# Patient Record
Sex: Female | Born: 1980 | Race: Black or African American | Hispanic: No | Marital: Single | Smoking: Never smoker
Health system: Southern US, Community
[De-identification: ages and names within clinical notes are randomized; demographics above are authoritative.]

## PROBLEM LIST (undated history)

## (undated) DIAGNOSIS — G43009 Migraine without aura, not intractable, without status migrainosus: Secondary | ICD-10-CM

## (undated) DIAGNOSIS — D649 Anemia, unspecified: Secondary | ICD-10-CM

## (undated) DIAGNOSIS — J309 Allergic rhinitis, unspecified: Secondary | ICD-10-CM

## (undated) DIAGNOSIS — Z8742 Personal history of other diseases of the female genital tract: Secondary | ICD-10-CM

## (undated) HISTORY — DX: Anemia, unspecified: D64.9

## (undated) HISTORY — DX: Personal history of other diseases of the female genital tract: Z87.42

## (undated) HISTORY — DX: Allergic rhinitis, unspecified: J30.9

## (undated) HISTORY — DX: Migraine without aura, not intractable, without status migrainosus: G43.009

---
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
## 2001-09-15 ENCOUNTER — Encounter: Payer: Self-pay | Admitting: Obstetrics and Gynecology

## 2001-09-15 ENCOUNTER — Ambulatory Visit (HOSPITAL_COMMUNITY): Admission: RE | Admit: 2001-09-15 | Discharge: 2001-09-15 | Payer: Self-pay | Admitting: Obstetrics and Gynecology

## 2001-11-09 ENCOUNTER — Inpatient Hospital Stay (HOSPITAL_COMMUNITY): Admission: AD | Admit: 2001-11-09 | Discharge: 2001-11-11 | Payer: Self-pay | Admitting: Obstetrics and Gynecology

## 2001-11-12 ENCOUNTER — Encounter: Admission: RE | Admit: 2001-11-12 | Discharge: 2001-12-12 | Payer: Self-pay | Admitting: Obstetrics & Gynecology

## 2001-11-27 ENCOUNTER — Encounter (INDEPENDENT_AMBULATORY_CARE_PROVIDER_SITE_OTHER): Payer: Self-pay | Admitting: *Deleted

## 2001-11-27 LAB — CONVERTED CEMR LAB

## 2001-12-13 ENCOUNTER — Encounter: Admission: RE | Admit: 2001-12-13 | Discharge: 2002-01-12 | Payer: Self-pay | Admitting: Obstetrics & Gynecology

## 2001-12-21 ENCOUNTER — Other Ambulatory Visit: Admission: RE | Admit: 2001-12-21 | Discharge: 2001-12-21 | Payer: Self-pay | Admitting: Obstetrics and Gynecology

## 2002-05-18 ENCOUNTER — Emergency Department (HOSPITAL_COMMUNITY): Admission: EM | Admit: 2002-05-18 | Discharge: 2002-05-18 | Payer: Self-pay | Admitting: Emergency Medicine

## 2002-05-23 ENCOUNTER — Encounter: Admission: RE | Admit: 2002-05-23 | Discharge: 2002-05-23 | Payer: Self-pay | Admitting: Family Medicine

## 2002-06-23 ENCOUNTER — Encounter: Admission: RE | Admit: 2002-06-23 | Discharge: 2002-06-23 | Payer: Self-pay | Admitting: Family Medicine

## 2002-08-29 ENCOUNTER — Encounter: Admission: RE | Admit: 2002-08-29 | Discharge: 2002-08-29 | Payer: Self-pay | Admitting: Family Medicine

## 2002-10-23 ENCOUNTER — Encounter: Payer: Self-pay | Admitting: Sports Medicine

## 2002-10-23 ENCOUNTER — Encounter: Admission: RE | Admit: 2002-10-23 | Discharge: 2002-10-23 | Payer: Self-pay | Admitting: Sports Medicine

## 2003-12-18 ENCOUNTER — Other Ambulatory Visit: Admission: RE | Admit: 2003-12-18 | Discharge: 2003-12-18 | Payer: Self-pay | Admitting: Obstetrics and Gynecology

## 2004-10-08 ENCOUNTER — Ambulatory Visit: Payer: Self-pay | Admitting: Internal Medicine

## 2005-02-16 ENCOUNTER — Ambulatory Visit: Payer: Self-pay | Admitting: Endocrinology

## 2005-05-01 ENCOUNTER — Other Ambulatory Visit: Admission: RE | Admit: 2005-05-01 | Discharge: 2005-05-01 | Payer: Self-pay | Admitting: Obstetrics and Gynecology

## 2005-05-27 ENCOUNTER — Ambulatory Visit: Payer: Self-pay | Admitting: Internal Medicine

## 2005-07-21 ENCOUNTER — Ambulatory Visit: Payer: Self-pay | Admitting: Internal Medicine

## 2005-09-28 ENCOUNTER — Ambulatory Visit: Payer: Self-pay | Admitting: Internal Medicine

## 2006-07-06 ENCOUNTER — Other Ambulatory Visit: Admission: RE | Admit: 2006-07-06 | Discharge: 2006-07-06 | Payer: Self-pay | Admitting: Obstetrics & Gynecology

## 2006-08-02 ENCOUNTER — Ambulatory Visit: Payer: Self-pay | Admitting: Internal Medicine

## 2006-08-20 ENCOUNTER — Ambulatory Visit: Payer: Self-pay | Admitting: Internal Medicine

## 2006-08-26 ENCOUNTER — Other Ambulatory Visit: Admission: RE | Admit: 2006-08-26 | Discharge: 2006-08-26 | Payer: Self-pay | Admitting: Obstetrics & Gynecology

## 2006-08-26 DIAGNOSIS — M2550 Pain in unspecified joint: Secondary | ICD-10-CM | POA: Insufficient documentation

## 2006-08-27 ENCOUNTER — Encounter (INDEPENDENT_AMBULATORY_CARE_PROVIDER_SITE_OTHER): Payer: Self-pay | Admitting: *Deleted

## 2006-09-28 ENCOUNTER — Other Ambulatory Visit: Admission: RE | Admit: 2006-09-28 | Discharge: 2006-09-28 | Payer: Self-pay | Admitting: Obstetrics & Gynecology

## 2006-09-29 ENCOUNTER — Ambulatory Visit: Payer: Self-pay | Admitting: Internal Medicine

## 2006-09-29 LAB — CONVERTED CEMR LAB
AST: 15 units/L (ref 0–37)
Bilirubin, Direct: 0.1 mg/dL (ref 0.0–0.3)
Hep A IgM: NEGATIVE
Hep B C IgM: NEGATIVE
Mono Screen: NEGATIVE

## 2007-02-16 ENCOUNTER — Encounter: Payer: Self-pay | Admitting: Internal Medicine

## 2007-03-09 ENCOUNTER — Other Ambulatory Visit: Admission: RE | Admit: 2007-03-09 | Discharge: 2007-03-09 | Payer: Self-pay | Admitting: Obstetrics and Gynecology

## 2007-04-07 ENCOUNTER — Ambulatory Visit: Payer: Self-pay | Admitting: Internal Medicine

## 2007-04-07 ENCOUNTER — Encounter: Payer: Self-pay | Admitting: Internal Medicine

## 2007-04-07 DIAGNOSIS — G43009 Migraine without aura, not intractable, without status migrainosus: Secondary | ICD-10-CM

## 2007-04-07 DIAGNOSIS — J309 Allergic rhinitis, unspecified: Secondary | ICD-10-CM

## 2007-04-07 DIAGNOSIS — J019 Acute sinusitis, unspecified: Secondary | ICD-10-CM

## 2007-04-07 HISTORY — DX: Migraine without aura, not intractable, without status migrainosus: G43.009

## 2007-04-07 HISTORY — DX: Allergic rhinitis, unspecified: J30.9

## 2007-09-27 ENCOUNTER — Ambulatory Visit: Payer: Self-pay | Admitting: Internal Medicine

## 2007-10-06 ENCOUNTER — Other Ambulatory Visit: Admission: RE | Admit: 2007-10-06 | Discharge: 2007-10-06 | Payer: Self-pay | Admitting: Obstetrics and Gynecology

## 2007-10-21 ENCOUNTER — Telehealth (INDEPENDENT_AMBULATORY_CARE_PROVIDER_SITE_OTHER): Payer: Self-pay | Admitting: *Deleted

## 2008-02-24 ENCOUNTER — Other Ambulatory Visit: Admission: RE | Admit: 2008-02-24 | Discharge: 2008-02-24 | Payer: Self-pay | Admitting: Obstetrics and Gynecology

## 2008-10-07 ENCOUNTER — Inpatient Hospital Stay (HOSPITAL_COMMUNITY): Admission: AD | Admit: 2008-10-07 | Discharge: 2008-10-07 | Payer: Self-pay | Admitting: Obstetrics and Gynecology

## 2008-10-22 ENCOUNTER — Inpatient Hospital Stay (HOSPITAL_COMMUNITY): Admission: AD | Admit: 2008-10-22 | Discharge: 2008-10-22 | Payer: Self-pay | Admitting: Obstetrics and Gynecology

## 2008-10-22 ENCOUNTER — Inpatient Hospital Stay (HOSPITAL_COMMUNITY): Admission: AD | Admit: 2008-10-22 | Discharge: 2008-10-24 | Payer: Self-pay | Admitting: Obstetrics and Gynecology

## 2009-02-04 ENCOUNTER — Telehealth: Payer: Self-pay | Admitting: Internal Medicine

## 2010-03-04 ENCOUNTER — Ambulatory Visit (HOSPITAL_COMMUNITY): Admission: RE | Admit: 2010-03-04 | Discharge: 2010-03-04 | Payer: Self-pay | Admitting: Obstetrics

## 2010-07-04 ENCOUNTER — Ambulatory Visit (HOSPITAL_COMMUNITY)
Admission: RE | Admit: 2010-07-04 | Discharge: 2010-07-04 | Payer: Self-pay | Source: Home / Self Care | Attending: Obstetrics | Admitting: Obstetrics

## 2010-07-21 ENCOUNTER — Inpatient Hospital Stay (HOSPITAL_COMMUNITY)
Admission: AD | Admit: 2010-07-21 | Discharge: 2010-07-23 | Payer: Self-pay | Source: Home / Self Care | Attending: Obstetrics | Admitting: Obstetrics

## 2010-07-22 LAB — CBC
HCT: 34 % — ABNORMAL LOW (ref 36.0–46.0)
Hemoglobin: 11.5 g/dL — ABNORMAL LOW (ref 12.0–15.0)
MCH: 28.7 pg (ref 26.0–34.0)
MCHC: 33.8 g/dL (ref 30.0–36.0)
MCV: 84.8 fL (ref 78.0–100.0)
Platelets: 126 10*3/uL — ABNORMAL LOW (ref 150–400)
RBC: 4.01 MIL/uL (ref 3.87–5.11)
RDW: 13.7 % (ref 11.5–15.5)
WBC: 12.8 10*3/uL — ABNORMAL HIGH (ref 4.0–10.5)

## 2010-07-22 LAB — RPR: RPR Ser Ql: NONREACTIVE

## 2010-07-23 LAB — CBC
HCT: 34 % — ABNORMAL LOW (ref 36.0–46.0)
Hemoglobin: 11.5 g/dL — ABNORMAL LOW (ref 12.0–15.0)
MCH: 28.8 pg (ref 26.0–34.0)
MCV: 85.2 fL (ref 78.0–100.0)
Platelets: 126 10*3/uL — ABNORMAL LOW (ref 150–400)
RBC: 3.99 MIL/uL (ref 3.87–5.11)
WBC: 13.2 10*3/uL — ABNORMAL HIGH (ref 4.0–10.5)

## 2010-07-24 LAB — CCBB MATERNAL DONOR DRAW

## 2010-09-23 ENCOUNTER — Ambulatory Visit (HOSPITAL_COMMUNITY)
Admission: RE | Admit: 2010-09-23 | Discharge: 2010-09-23 | Disposition: A | Discharge status: Home / Self Care | Payer: Medicaid Other | Source: Ambulatory Visit | Attending: Obstetrics & Gynecology | Admitting: Obstetrics & Gynecology

## 2010-09-23 DIAGNOSIS — Z302 Encounter for sterilization: Secondary | ICD-10-CM | POA: Insufficient documentation

## 2010-09-23 LAB — CBC
MCH: 28.8 pg (ref 26.0–34.0)
MCV: 86.1 fL (ref 78.0–100.0)
Platelets: 156 10*3/uL (ref 150–400)
RBC: 4.59 MIL/uL (ref 3.87–5.11)

## 2010-09-25 NOTE — Op Note (Signed)
  Holly Mcguire, GODLEY               ACCOUNT NO.:  [id number]  MEDICAL RECORD NO.:  [id number]           PATIENT TYPE:  O  LOCATION:  [facility]                          [facility]  PHYSICIAN:  Roseanna Rainbow, M.D.DATE OF BIRTH:  September 22, 1980  DATE OF PROCEDURE:  09/23/2010 DATE OF DISCHARGE:                              OPERATIVE REPORT   PREOPERATIVE DIAGNOSIS:  Multiparity, desires sterilization procedure.  POSTOPERATIVE DIAGNOSIS:  Multiparity, desires sterilization procedure.  PROCEDURE:  Essure procedure.  SURGEON:  Roseanna Rainbow, MD  ANESTHESIA:  Laryngeal mask airway, paracervical block.  FINDINGS:  The endometrium in the right cornual region was slightly lush appearing.  There were no discrete lesions.  ESTIMATED BLOOD LOSS:  Minimal.  COMPLICATIONS:  Cervical laceration from the tenaculum.  PROCEDURE IN DETAIL:  The patient was taken to the operating room.  A laryngeal mask airway was placed.  She was placed in the semi-lithotomy position in [place] stirrups and prepped and draped in the usual sterile fashion.  After a time-out had been completed, a speculum was placed in the patient's vagina.  The anterior lip of the cervix was infiltrated with 2 mL of 2% lidocaine plain.  The single-tooth tenaculum was then applied to this location.  4 mL of 2% lidocaine were infiltrated at 0.7 o'clock to produce a paracervical block.  The hysteroscope was then advanced slowly through the cervical canal and into the lower uterine segment and fundal region.  During this process, a single-tooth tenaculum tore through the cervix and had to be reapplied.  Considering the above findings, the right fallopian tube was cannulated with the Essure device first.  After the device was deployed, there were 5 trailing coils noted.  This was repeated on the left side.  Again after the device was deployed, there were 4 trailing coils noted.  All the instruments were then removed.  The  above-noted cervical laceration was repaired with figure-of-eight sutures of 2-0 Vicryl.  There was adequate hemostasis noted.  At the close of the procedure, the instrument and pack counts were said to be correct x2.  There was a fairly large distending media deficit, however, 90% of the 700 mL deficit was on the floor and the provider.     Roseanna Rainbow, M.D.     Judee Clara  D:  09/23/2010  T:  09/24/2010  Job:  [id number]  Electronically Signed by Antionette Char M.D. on 09/25/2010 07:15:56 PM

## 2010-10-08 LAB — CBC
Hemoglobin: 13.7 g/dL (ref 12.0–15.0)
MCHC: 34.2 g/dL (ref 30.0–36.0)
MCHC: 34.3 g/dL (ref 30.0–36.0)
MCV: 94.1 fL (ref 78.0–100.0)
MCV: 94.6 fL (ref 78.0–100.0)
Platelets: 152 10*3/uL (ref 150–400)
RDW: 13.6 % (ref 11.5–15.5)
RDW: 13.7 % (ref 11.5–15.5)
WBC: 15.7 10*3/uL — ABNORMAL HIGH (ref 4.0–10.5)

## 2010-10-08 LAB — URINALYSIS, ROUTINE W REFLEX MICROSCOPIC
Ketones, ur: NEGATIVE mg/dL
Leukocytes, UA: NEGATIVE
Nitrite: NEGATIVE
Protein, ur: 30 mg/dL — AB
Urobilinogen, UA: 0.2 mg/dL (ref 0.0–1.0)
pH: 8 (ref 5.0–8.0)

## 2010-11-05 ENCOUNTER — Other Ambulatory Visit: Payer: Self-pay | Admitting: Obstetrics & Gynecology

## 2010-12-23 ENCOUNTER — Other Ambulatory Visit: Payer: Self-pay | Admitting: Obstetrics & Gynecology

## 2010-12-23 DIAGNOSIS — N971 Female infertility of tubal origin: Secondary | ICD-10-CM

## 2010-12-24 ENCOUNTER — Ambulatory Visit (HOSPITAL_COMMUNITY): Payer: Medicaid Other

## 2010-12-30 ENCOUNTER — Inpatient Hospital Stay (HOSPITAL_COMMUNITY)
Admission: AD | Admit: 2010-12-30 | Discharge: 2010-12-30 | Disposition: A | Discharge status: Home / Self Care | Payer: Medicaid Other | Source: Ambulatory Visit | Attending: Obstetrics | Admitting: Obstetrics

## 2010-12-30 ENCOUNTER — Inpatient Hospital Stay (HOSPITAL_COMMUNITY): Payer: Medicaid Other

## 2010-12-30 DIAGNOSIS — R109 Unspecified abdominal pain: Secondary | ICD-10-CM | POA: Insufficient documentation

## 2010-12-30 DIAGNOSIS — A499 Bacterial infection, unspecified: Secondary | ICD-10-CM

## 2010-12-30 DIAGNOSIS — N760 Acute vaginitis: Secondary | ICD-10-CM | POA: Insufficient documentation

## 2010-12-30 DIAGNOSIS — B9689 Other specified bacterial agents as the cause of diseases classified elsewhere: Secondary | ICD-10-CM | POA: Insufficient documentation

## 2010-12-30 DIAGNOSIS — R52 Pain, unspecified: Secondary | ICD-10-CM

## 2010-12-30 LAB — WET PREP, GENITAL
Trich, Wet Prep: NONE SEEN
Yeast Wet Prep HPF POC: NONE SEEN

## 2010-12-30 LAB — URINALYSIS, ROUTINE W REFLEX MICROSCOPIC
Glucose, UA: NEGATIVE mg/dL
Leukocytes, UA: NEGATIVE
Specific Gravity, Urine: 1.025 (ref 1.005–1.030)
pH: 6 (ref 5.0–8.0)

## 2010-12-30 LAB — URINE MICROSCOPIC-ADD ON

## 2010-12-31 LAB — GC/CHLAMYDIA PROBE AMP, GENITAL: Chlamydia, DNA Probe: NEGATIVE

## 2011-01-12 ENCOUNTER — Other Ambulatory Visit: Payer: Self-pay | Admitting: Obstetrics & Gynecology

## 2011-01-12 DIAGNOSIS — N971 Female infertility of tubal origin: Secondary | ICD-10-CM

## 2011-01-15 ENCOUNTER — Ambulatory Visit (HOSPITAL_COMMUNITY)
Admission: RE | Admit: 2011-01-15 | Discharge: 2011-01-15 | Disposition: A | Discharge status: Home / Self Care | Payer: Medicaid Other | Source: Ambulatory Visit | Attending: Obstetrics & Gynecology | Admitting: Obstetrics & Gynecology

## 2011-01-15 DIAGNOSIS — Z3049 Encounter for surveillance of other contraceptives: Secondary | ICD-10-CM | POA: Insufficient documentation

## 2011-01-15 DIAGNOSIS — N971 Female infertility of tubal origin: Secondary | ICD-10-CM

## 2011-01-15 MED ORDER — IOHEXOL 300 MG/ML  SOLN
5.0000 mL | Freq: Once | INTRAMUSCULAR | Status: AC | PRN
  Administered 2011-01-15: 5 mL

## 2012-04-06 IMAGING — US US OB FOLLOW-UP
1 series · 12 of 28 positions shown · non-contrast
Comparison: none

[Series 1: us ob follow up · 12 of 32 slices shown]
[im 2/32]
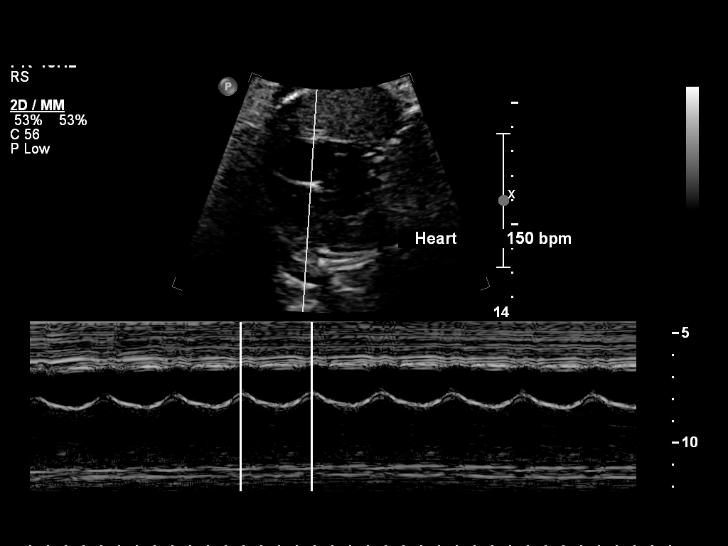
[im 4/32]
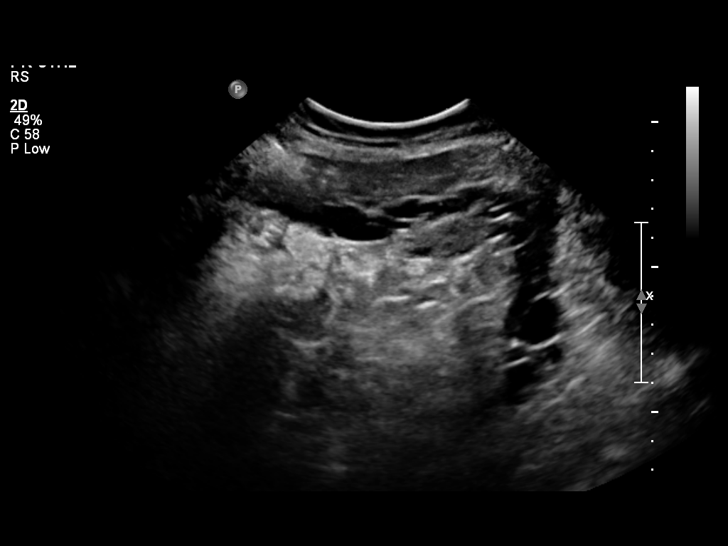
[im 6/32]
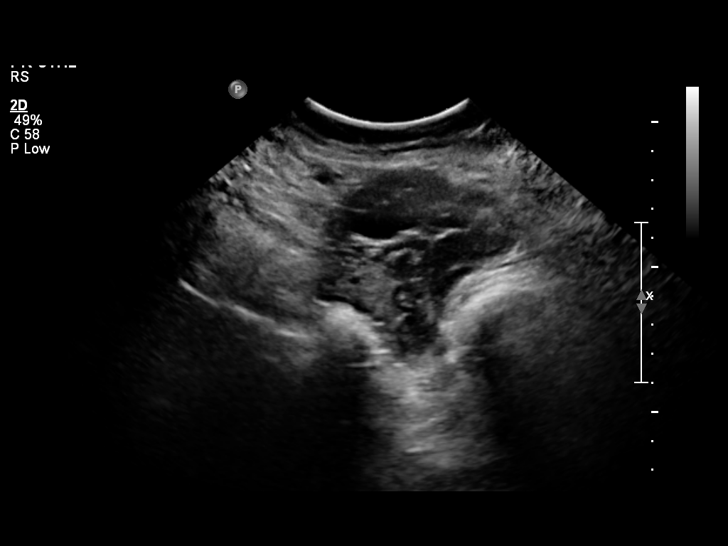
[im 10/32]
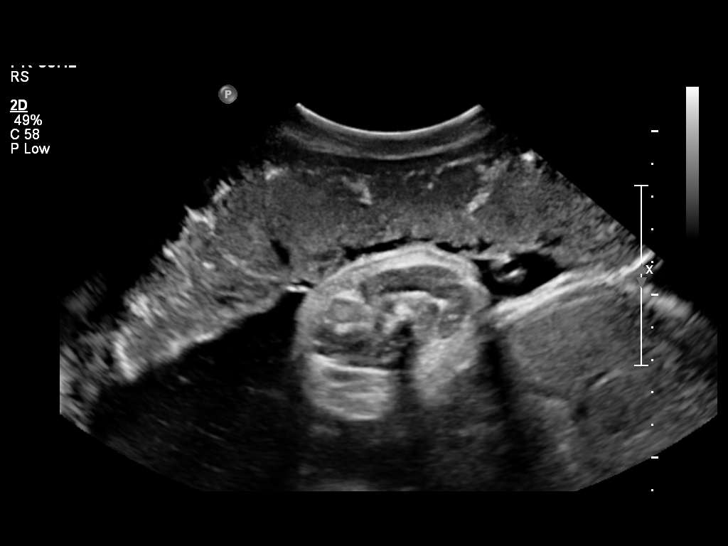
[im 12/32]
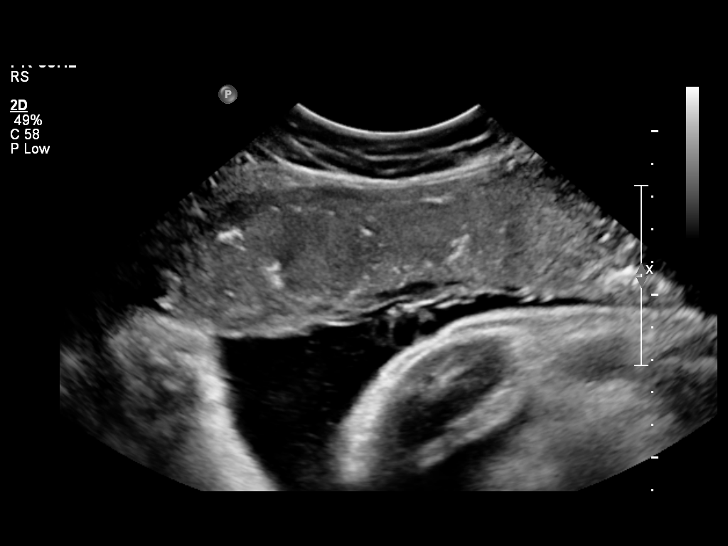
[im 14/32]
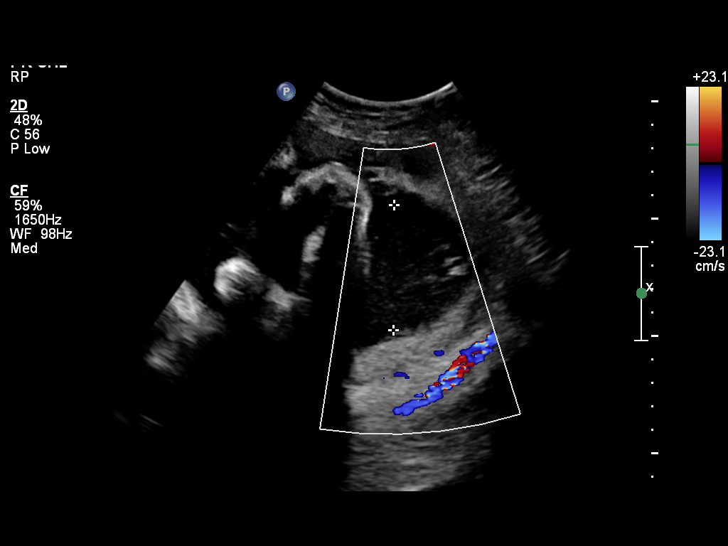
[im 18/32]
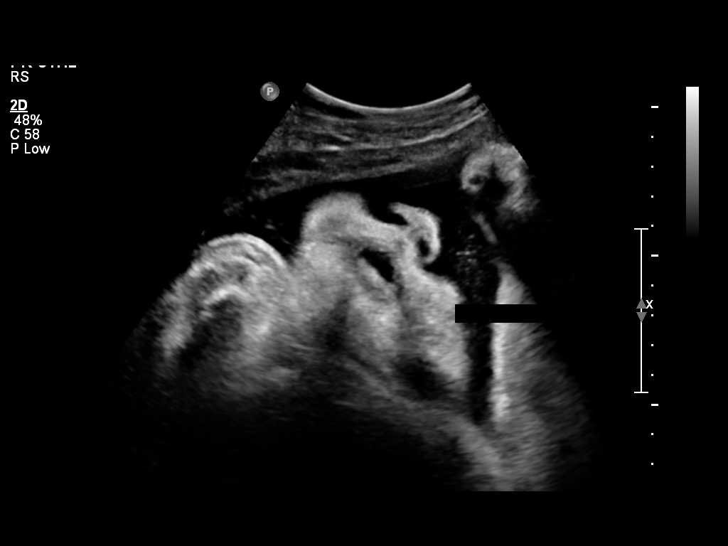
[im 20/32]
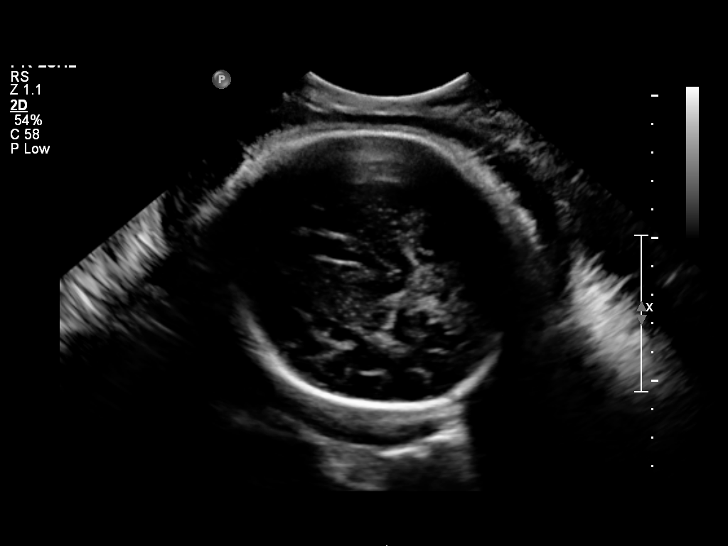
[im 22/32]
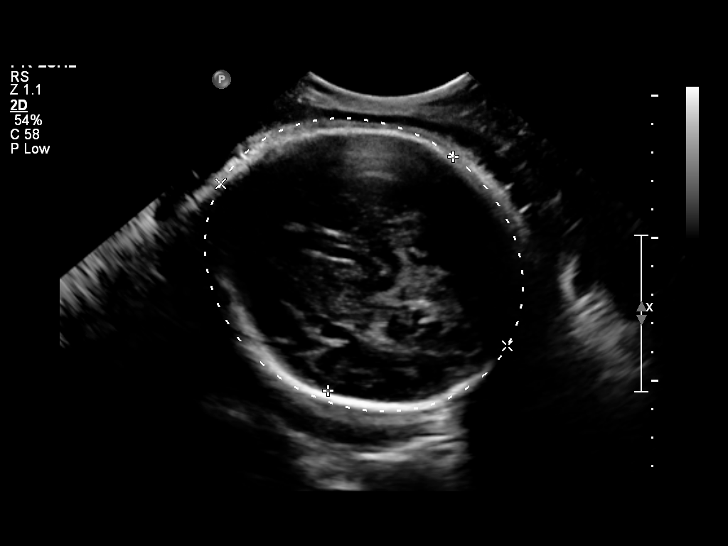
[im 26/32]
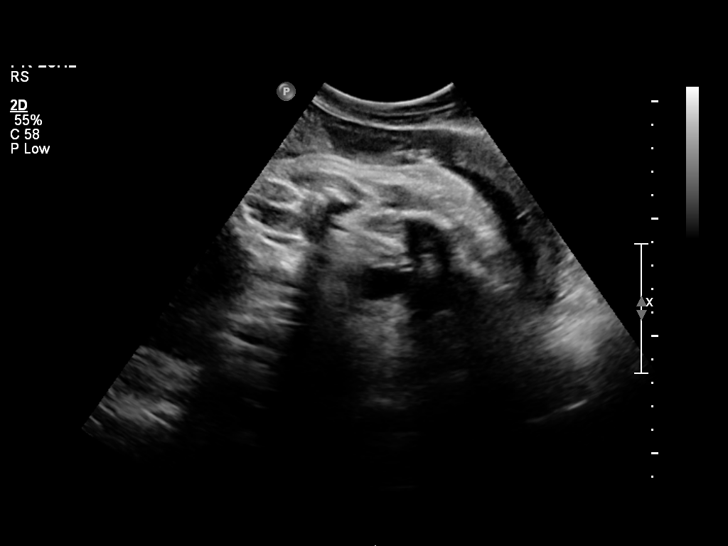
[im 28/32]
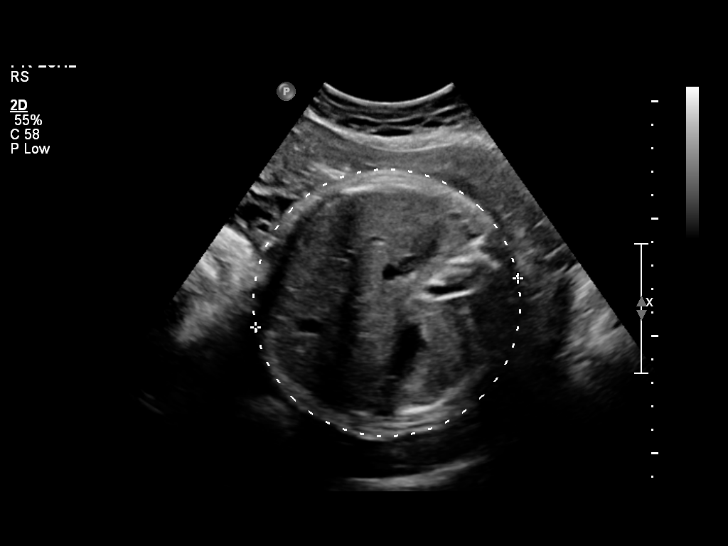
[im 30/32]
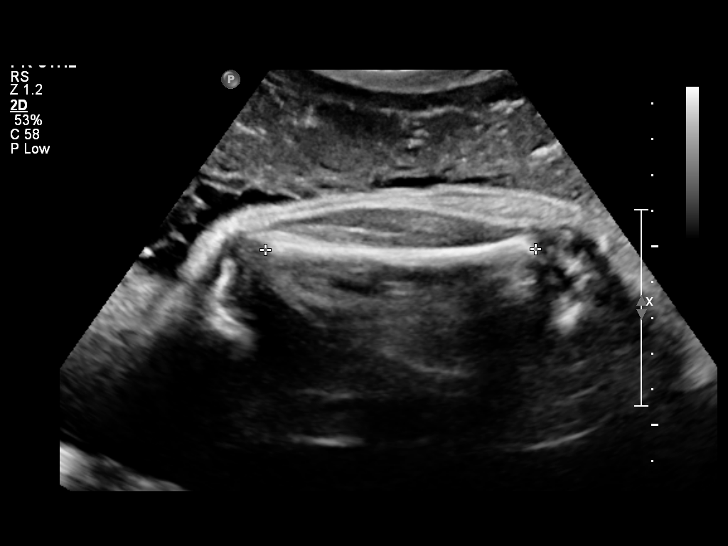

[12 of 28 positions shown; findings below may reference images not displayed]

OBSTETRICS REPORT
                      (Signed Final 07/04/2010 [DATE])

 Order#:         _O
Procedures

 US OB FOLLOW UP                                       76816.1
Indications

 Size greater than dates (Large for gestational [AGE]
Fetal Evaluation

 Fetal Heart Rate:  150                          bpm
 Cardiac Activity:  Observed
 Presentation:      Cephalic
 Placenta:          Anterior, above cervical os

 Amniotic Fluid
 AFI FV:      Subjectively within normal limits
 AFI Sum:     20.77   cm       82  %Tile     Larg Pckt:    7.22  cm
 RUQ:   7.22    cm   RLQ:    3.8    cm    LUQ:   4.43    cm   LLQ:    5.32   cm
Biometry

 BPD:     93.3  mm     G. Age:  38w 0d                CI:        78.83   70 - 86
                                                      FL/HC:      22.6   20.9 -

 HC:     332.3  mm     G. Age:  38w 0d       28  %    HC/AC:      0.93   0.92 -

 AC:     357.2  mm     G. Age:  39w 4d       95  %    FL/BPD:     80.4   71 - 87
 FL:        75  mm     G. Age:  38w 3d       61  %    FL/AC:      21.0   20 - 24

 Est. FW:    7479  gm           8 lb   > 90  %
Gestational Age

 U/S Today:     38w 4d                                        EDD:   07/14/10
 Best:          38w 0d     Det. By:  U/S (03/04/10)           EDD:   07/18/10
Anatomy

 Cranium:           Appears normal      Aortic Arch:       Previously seen
 Fetal Cavum:       Appears normal      Ductal Arch:       Previously seen
 Ventricles:        Appears normal      Diaphragm:         Previously seen
 Choroid Plexus:    Previously seen     Stomach:           Appears normal
 Cerebellum:        Previously seen     Abdomen:           Previously seen
 Posterior Fossa:   Previously seen     Abdominal Wall:    Previously seen
 Nuchal Fold:       Previously seen     Cord Vessels:      Previously seen
 Face:              Previously seen     Kidneys:           Appear normal
 Heart:             Appears normal      Bladder:           Appears normal
                    (4 chamber &
                    axis)
 RVOT:              Previously seen     Spine:             Previously seen
 LVOT:              Previously seen     Limbs:             Previously seen

 Other:     Male gender. Heels and 5th digit previously visualized.
            Nasal bone previously visualized.
Cervix Uterus Adnexa

 Cervix:       Not visualized (advanced GA >34 wks)

 Adnexa:     No abnormality visualized.
Comments

 EFW range (+ / - 15%) =  8398 - 7097 grams
Impression

 Single intrauterine gestation demonstrating an estimated
 gestational age by ultrasound of 38w 4d.   The AC is larger
 than the remaining gestational indicators resulting in an EFW
 > 90% and compatible with a LGA fetus.

 No late developing fetal anatomic abnormalities are noted
 associated with the lateral ventricles, four chamber heart,
 stomach, kidneys or bladder.

 Subjectively normal and quantitatively upper normal amniotic
 fluid volume with an AFI at the 82%.

 questions or concerns.

## 2012-10-02 IMAGING — US US TRANSVAGINAL NON-OB
1 series · 13 of 25 positions shown · non-contrast
Comparison: 12/07/2010

CLINICAL DATA: Left lower quadrant pain with nausea, bloating and
vaginal discharge.  Essure in place for 3 months.

TRANSABDOMINAL AND TRANSVAGINAL ULTRASOUND OF PELVIS
TECHNIQUE: Both transabdominal and transvaginal ultrasound
examinations of the pelvis were performed. Transabdominal technique
was performed for global imaging of the pelvis including uterus,
ovaries, adnexal regions, and pelvic cul-de-sac.

[Series 1: us pelvis complete · 13 of 64 slices shown]
[im 1/64]
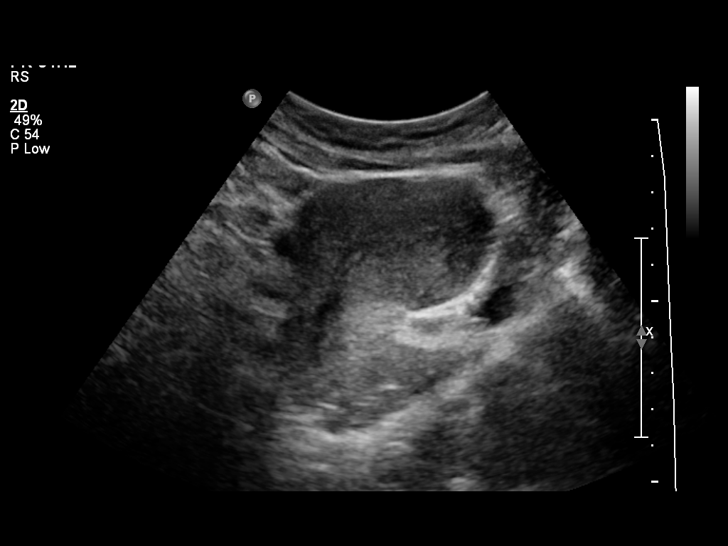
[im 6/64]
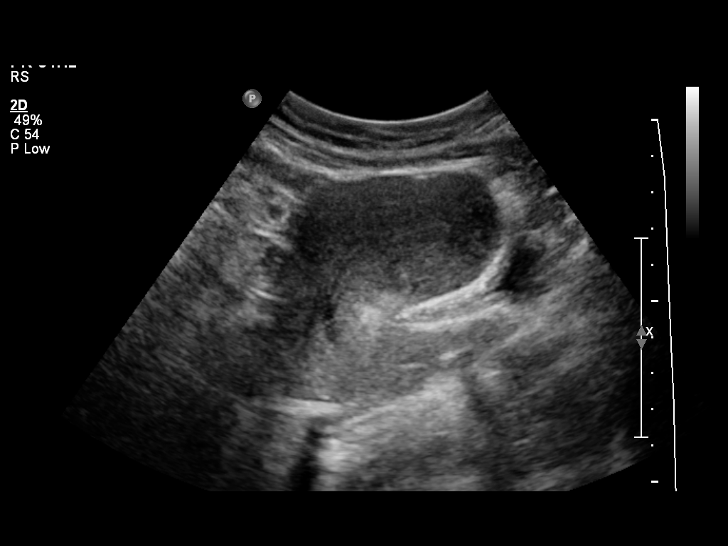
[im 11/64]
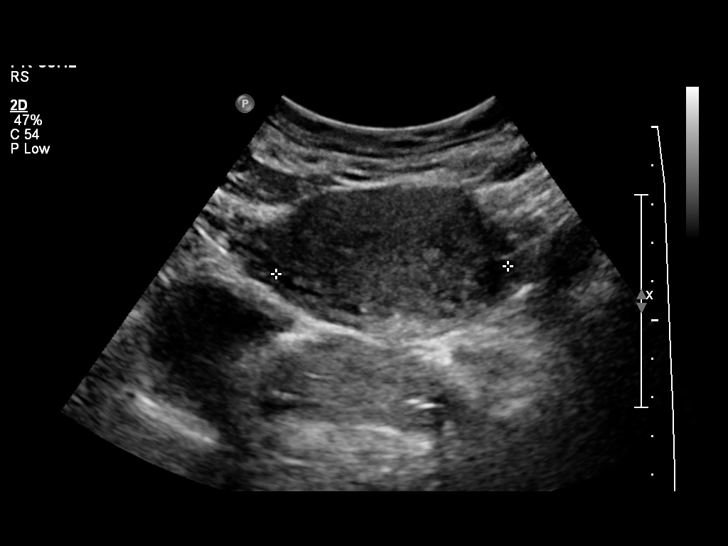
[im 16/64]
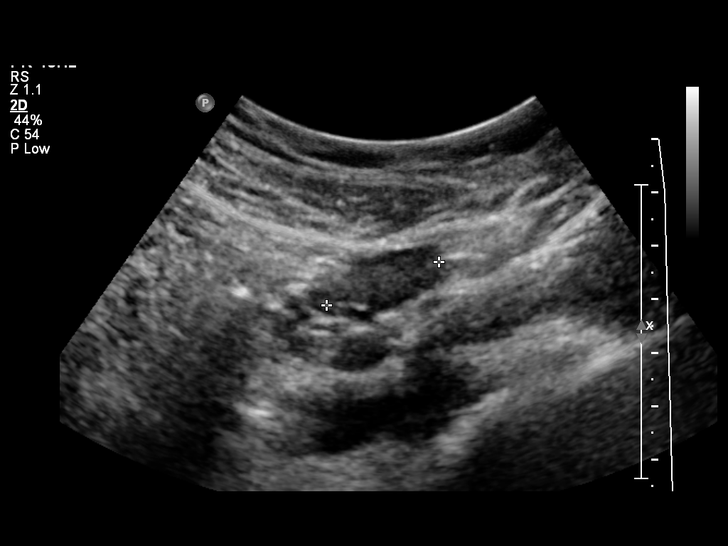
[im 22/64]
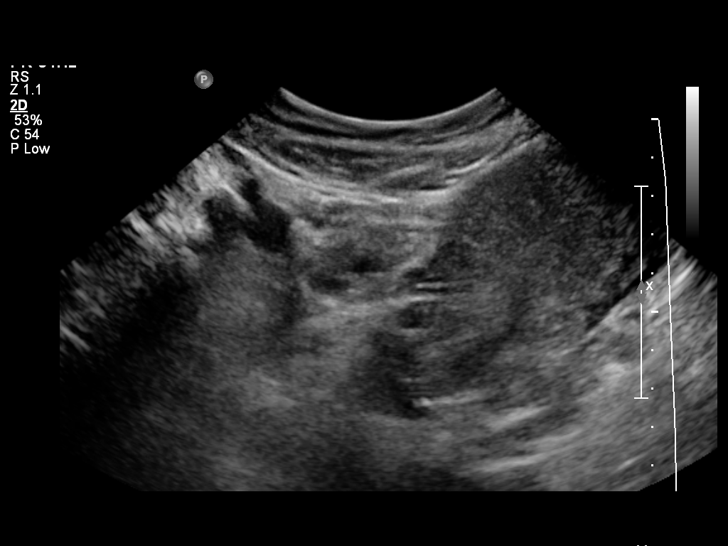
[im 27/64]
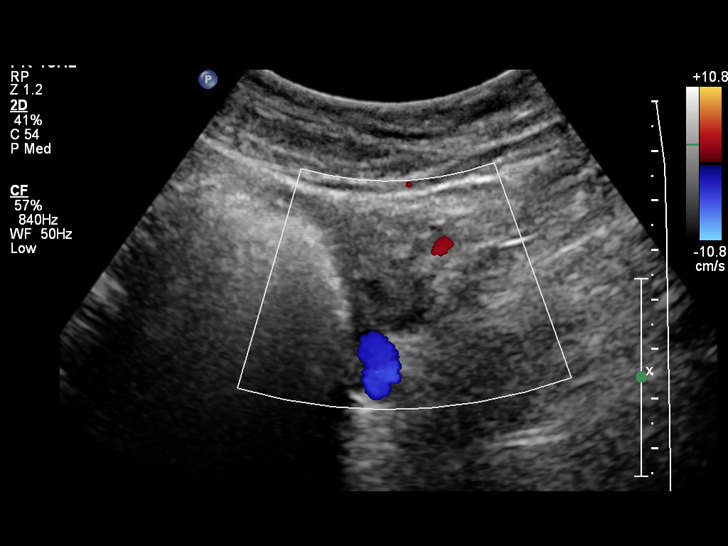
[im 32/64]
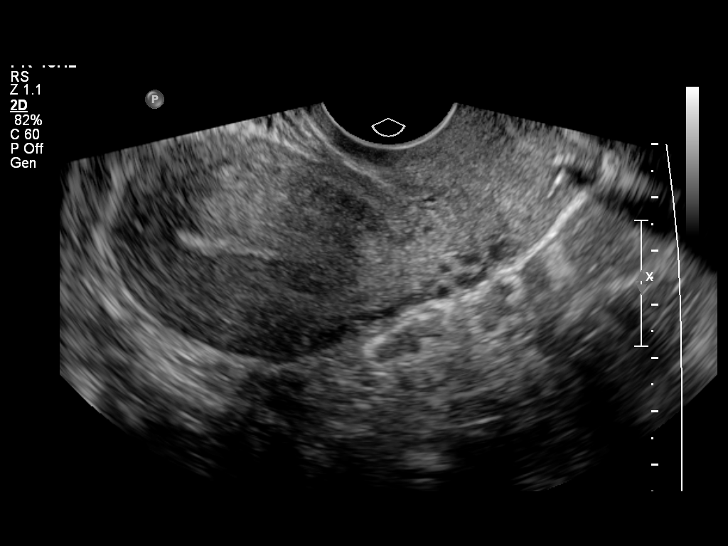
[im 37/64]
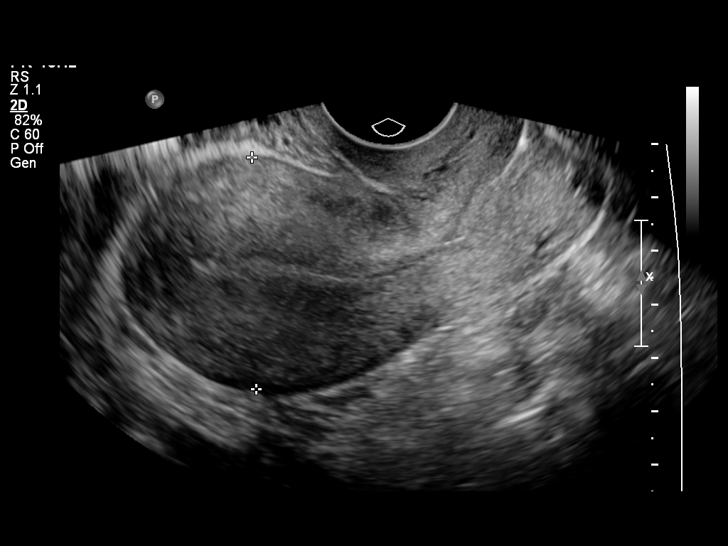
[im 43/64]
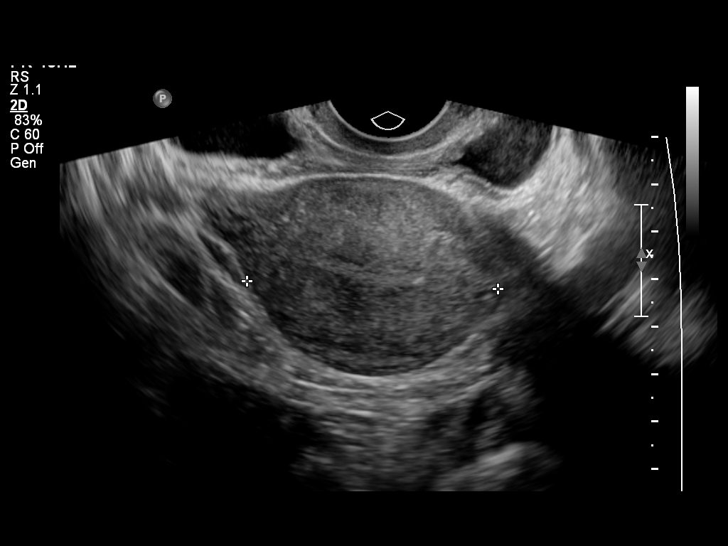
[im 48/64]
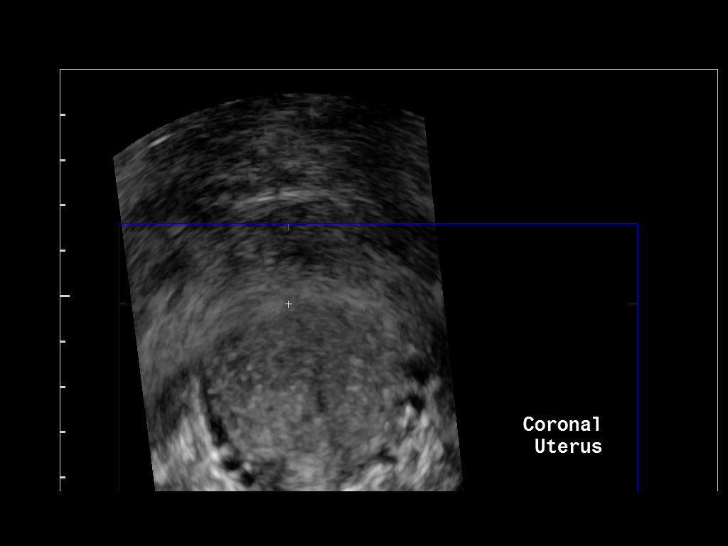
[im 53/64]
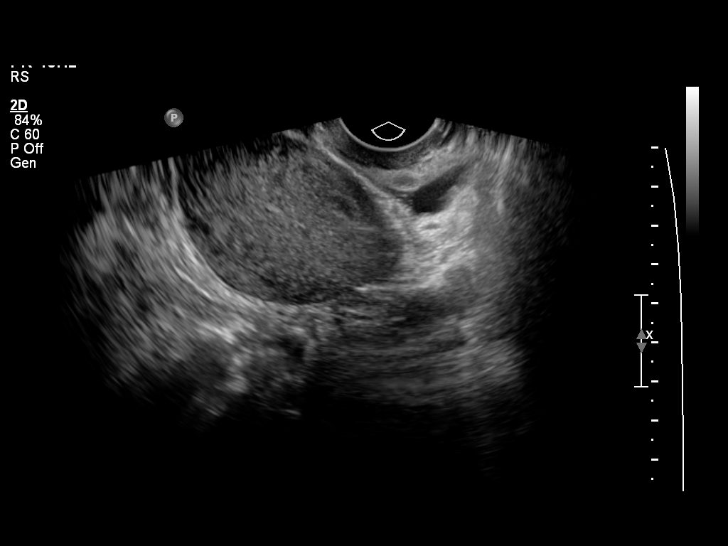
[im 58/64]
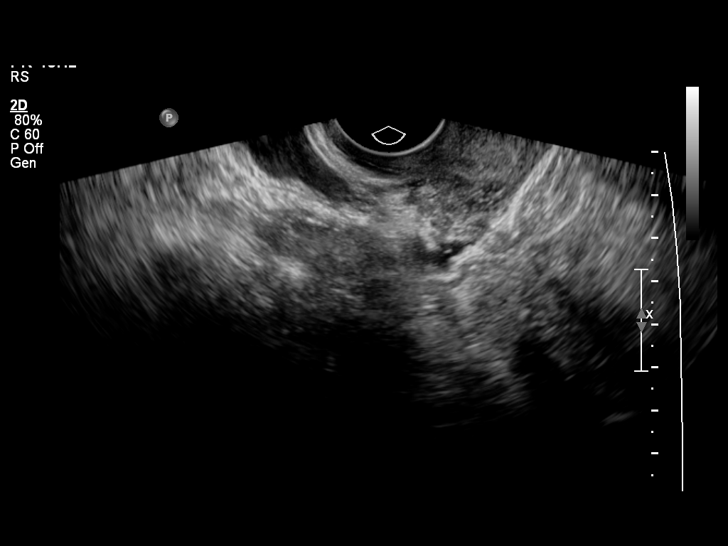
[im 64/64]
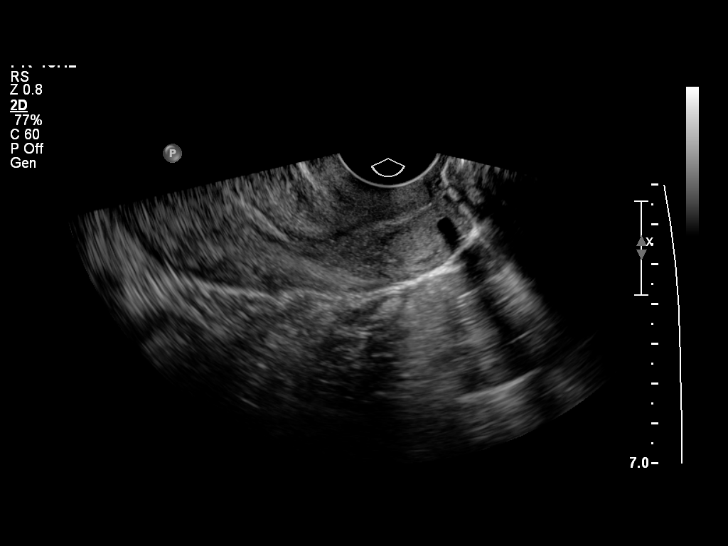

[13 of 25 positions shown; findings below may reference images not displayed]

It was necessary to proceed with endovaginal exam following the
transabdomnial exam to visualize the endometrium and ovaries.
FINDINGS: Uterus: Demonstrates a sagittal length of 8.8 cm, AP depth of
cm and a transverse width of 5.3 cm.  A homogeneous uterine
myometrium is seen.  The patient has Essure implants which are
identified in the region of the cornual-tubal junction bilaterally.

Endometrium: Is thin and echogenic with an AP width of 5.4 mm.  No
areas of focal thickening or heterogeneity are noted.

Right ovary:  Demonstrates a normal appearance measuring 2.8 x
x 1.6 cm

Left ovary: Demonstrates a normal appearance measuring 1.85-0.0 x
3.0 cm

Other findings: No pelvic fluid or separate adnexal masses are
seen.
IMPRESSION: Bilateral Essure implants appear positioned in the region of the
cornual-tubal junction.  Normal uterine myometrium, endometrium and
ovaries.

It should be noted that confirmation of appropriate Essure implant
positioning and functionality cannot be assessed with this study.

## 2016-09-27 HISTORY — PX: CYST EXCISION: SHX5701

## 2016-09-28 ENCOUNTER — Ambulatory Visit (INDEPENDENT_AMBULATORY_CARE_PROVIDER_SITE_OTHER): Payer: BLUE CROSS/BLUE SHIELD | Admitting: Physician Assistant

## 2016-09-28 VITALS — BP 114/69 | HR 74 | Temp 98.7°F | Resp 17 | Ht 67.5 in | Wt 192.0 lb

## 2016-09-28 DIAGNOSIS — R221 Localized swelling, mass and lump, neck: Secondary | ICD-10-CM

## 2016-09-28 NOTE — Patient Instructions (Addendum)
I have put in a referral for ear, nose, and throat. They will contact you with the appointment. When they do, ask them what their fax number is so you can get your medical records faxed to them. Please let me know if you do not hear from them in 1 week. Thank you for letting me participate in your health and well being.   IF you received an x-ray today, you will receive an invoice from [facility]. Please contact [facility] at [phone] with questions or concerns regarding your invoice.   IF you received labwork today, you will receive an invoice from [facility]. Please contact [facility] at [phone] with questions or concerns regarding your invoice.   Our billing staff will not be able to assist you with questions regarding bills from these companies.  You will be contacted with the lab results as soon as they are available. The fastest way to get your results is to activate your My Chart account. Instructions are located on the last page of this paperwork. If you have not heard from [place] regarding the results in 2 weeks, please contact this office.

## 2016-09-28 NOTE — Progress Notes (Signed)
   Holly Mcguire  MRN: [id number] DOB: 10-31-1980  Subjective:  Holly Mcguire is a 36 y.o. female seen in office today for a chief complaint of bump on neck x 5 years. Denies change in size, pain, redness, warmth, and drainage. She had both an [place] and MRI of it  2 years ago and the report stated it was "fatty tissue." This was done in [place], [place]. She was referred to a Engineer, petroleum but did not have insurance at the time so did not follow up with them. Now that she has insurance she wants it taken care of.   Review of Systems  Constitutional: Negative for chills, diaphoresis and fever.  Gastrointestinal: Negative for nausea and vomiting.    Patient Active Problem List   Diagnosis Date Noted  . COMMON MIGRAINE 04/07/2007  . SINUSITIS- ACUTE-NOS 04/07/2007  . ALLERGIC RHINITIS 04/07/2007  . ARTHRALGIA, UNSPECIFIED 08/26/2006    No current outpatient prescriptions on file prior to visit.   No current facility-administered medications on file prior to visit.     Not on File   Objective:  BP 114/69   Pulse 74   Temp 98.7 F (37.1 C) (Oral)   Resp 17   Ht 5' 7.5" (1.715 m)   Wt 192 lb (87.1 kg)   LMP 09/28/2016 (Approximate)   SpO2 97%   BMI 29.63 kg/m   Physical Exam  Constitutional: She is oriented to person, place, and time and well-developed, well-nourished, and in no distress.  HENT:  Head: Normocephalic and atraumatic.  Eyes: Conjunctivae are normal.  Neck: Normal range of motion.    Pulmonary/Chest: Effort normal.  Lymphadenopathy:       Head (right side): No submental, no submandibular, no tonsillar, no preauricular, no posterior auricular and no occipital adenopathy present.       Head (left side): No submental, no submandibular, no tonsillar, no preauricular, no posterior auricular and no occipital adenopathy present.    She has no cervical adenopathy.       Right: No supraclavicular adenopathy present.       Left: No supraclavicular adenopathy  present.  Neurological: She is alert and oriented to person, place, and time. Gait normal.  Skin: Skin is warm and dry.  Psychiatric: Affect normal.  Vitals reviewed.   Assessment and Plan :  1. 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 Due to location, will refer to ENT. Pt instructed to gather her [place] and MRI results from [place] and have those faxed to the ENT office once she knows when/where her appointment is.  - Ambulatory referral to ENT  Benjiman Core PA-C  [facility] 09/28/2016 2:11 PM

## 2016-10-09 DIAGNOSIS — R221 Localized swelling, mass and lump, neck: Secondary | ICD-10-CM | POA: Diagnosis not present

## 2016-10-15 DIAGNOSIS — L720 Epidermal cyst: Secondary | ICD-10-CM | POA: Diagnosis not present

## 2016-10-20 DIAGNOSIS — Z124 Encounter for screening for malignant neoplasm of cervix: Secondary | ICD-10-CM | POA: Diagnosis not present

## 2016-10-20 DIAGNOSIS — Z01419 Encounter for gynecological examination (general) (routine) without abnormal findings: Secondary | ICD-10-CM | POA: Diagnosis not present

## 2016-10-20 DIAGNOSIS — N926 Irregular menstruation, unspecified: Secondary | ICD-10-CM | POA: Diagnosis not present

## 2016-10-20 DIAGNOSIS — Z3040 Encounter for surveillance of contraceptives, unspecified: Secondary | ICD-10-CM | POA: Diagnosis not present

## 2016-11-17 DIAGNOSIS — N920 Excessive and frequent menstruation with regular cycle: Secondary | ICD-10-CM | POA: Diagnosis not present

## 2016-11-17 DIAGNOSIS — Z01411 Encounter for gynecological examination (general) (routine) with abnormal findings: Secondary | ICD-10-CM | POA: Diagnosis not present

## 2016-11-19 ENCOUNTER — Ambulatory Visit (INDEPENDENT_AMBULATORY_CARE_PROVIDER_SITE_OTHER): Payer: BLUE CROSS/BLUE SHIELD | Admitting: Family Medicine

## 2016-11-19 ENCOUNTER — Encounter: Payer: Self-pay | Admitting: Family Medicine

## 2016-11-19 VITALS — BP 110/60 | HR 70 | Temp 97.9°F | Ht 65.75 in | Wt 191.3 lb

## 2016-11-19 DIAGNOSIS — R21 Rash and other nonspecific skin eruption: Secondary | ICD-10-CM | POA: Diagnosis not present

## 2016-11-19 DIAGNOSIS — Z7689 Persons encountering health services in other specified circumstances: Secondary | ICD-10-CM

## 2016-11-19 MED ORDER — TRIAMCINOLONE ACETONIDE 0.025 % EX CREA: 1.0000 "application " | TOPICAL_CREAM | Freq: Two times a day (BID) | CUTANEOUS | 0 refills | Status: AC

## 2016-11-19 MED ORDER — PERMETHRIN 5 % EX CREA: TOPICAL_CREAM | CUTANEOUS | 1 refills | Status: AC

## 2016-11-19 NOTE — Patient Instructions (Signed)
BEFORE YOU LEAVE: -follow up: CPE when convenient for patient in 3-6 months  Call exterminator.  Wash all bedding and clothing and pillow in hot water.  Can use zyrtec and triamcinolone for itch.   Sent the scabies treatment in case you also want to try that.  I hope you are feeling better soon! Seek care immediately if worsening, new concerns or you are not improving with treatment.

## 2016-11-19 NOTE — Progress Notes (Signed)
HPI:  Holly Mcguire is here to establish care. Has not had a PCP in a long time. Last PCP and physical: sees a gynecologist on regular basis for gyn exams  Has the following chronic problems that require follow up and concerns today:  Rash: -patch of itchy bumps on R abd and L antecubital region -started a few days ago -worried about scabies -no known exposures or recent travel -did have visitors a few weeks ago -her children do not sleep in her bed and they do not have this  ROS negative for unless reported above: fevers, unintentional weight loss, hearing or vision loss, chest pain, palpitations, struggling to breath, hemoptysis, melena, hematochezia, hematuria, falls, loc, si, thoughts of self harm  Past Medical History:  Diagnosis Date  . Allergic rhinitis 04/07/2007   Qualifier: Diagnosis of  By: Jonny RuizJohn MD, Len BlalockJames W   . Anemia    diagnosed by Dr Osborn CohoAngela Roberts per patient  . History of abnormal cervical Pap smear   . Migraine without aura 04/07/2007   Qualifier: Diagnosis of  By: Jonny RuizJohn MD, Len BlalockJames W     Past Surgical History:  Procedure Laterality Date  . CYST EXCISION  09/27/2016   Dr Salem SenateNewman-left side of neck-benign per patient    Family History  Problem Relation Age of Onset  . Leukemia Mother   . Prostate cancer Father   . Hypertension Father   . Hyperlipidemia Father   . Bipolar disorder Sister   . Bipolar disorder Brother     Social History   Social History  . Marital status: Single    Spouse name: N/A  . Number of children: N/A  . Years of education: N/A   Social History Main Topics  . Smoking status: Never Smoker  . Smokeless tobacco: Never Used  . Alcohol use No  . Drug use: No  . Sexual activity: No   Other Topics Concern  . 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  permethrin (ELIMITE) 5 % cream, Apply from neck down and leave on 8 hours then wash off. May repeat in 1-2 weeks if needed., Disp: 60 g, Rfl: 1 .  triamcinolone (KENALOG) 0.025 % cream, Apply 1 application topically 2 (two) times daily., Disp: 30 g, Rfl: 0  EXAM:  Vitals:   11/19/16 1457  BP: 110/60  Pulse: 70  Temp: 97.9 F (36.6 C)    Body mass index is 31.11 kg/m.  GENERAL: vitals reviewed and listed above, alert, oriented, appears well hydrated and in no acute distress  HEENT: atraumatic, conjunttiva clear, no obvious abnormalities on inspection of external nose and ears  NECK: no obvious masses on inspection  SKIN: small patch multiple small erythematous papules with central dot on abd and smaller patch of similar lesions L antecubital fossa  MS: moves all extremities without noticeable abnormality  PSYCH: pleasant and cooperative, no obvious depression or anxiety  ASSESSMENT AND PLAN:  Discussed the following assessment and plan:  Skin rash -we discussed possible serious and likely etiologies, workup and treatment, treatment risks and return precautions; appears to be insect bites and given distribution query bed bugs -she is quite anxious about this and worries about  scabies -opted for washing all bedding, clothes, pillows etc in hot water; exterminator; topical tx for symptoms and also treating with permethrin -follow up advised if symptoms persist despite these measures -of course, we advised Morgann  to return or notify a doctor immediately if symptoms worsen or persist or new concerns arise.   Encounter to establish care -We reviewed the PMH, PSH, FH, SH, Meds and Allergies. -We provided refills for any medications we will prescribe as needed. -We addressed current concerns per orders and patient instructions. -We have asked for records for pertinent exams, studies, vaccines and notes from previous providers. -We  have advised patient to follow up per instructions below.   -Patient advised to return or notify a doctor immediately if symptoms worsen or persist or new concerns arise.  Patient Instructions  BEFORE YOU LEAVE: -follow up: CPE when convenient for patient in 3-6 months  Call exterminator.  Wash all bedding and clothing and pillow in hot water.  Can use zyrtec and triamcinolone for itch.   Sent the scabies treatment in case you also want to try that.  I hope you are feeling better soon! Seek care immediately if worsening, new concerns or you are not improving with treatment.      Kriste Basque R.

## 2017-03-26 ENCOUNTER — Other Ambulatory Visit (HOSPITAL_COMMUNITY): Payer: Self-pay | Admitting: Obstetrics and Gynecology

## 2017-03-26 DIAGNOSIS — D649 Anemia, unspecified: Secondary | ICD-10-CM | POA: Diagnosis not present

## 2017-03-26 DIAGNOSIS — Z308 Encounter for other contraceptive management: Secondary | ICD-10-CM

## 2017-03-26 DIAGNOSIS — N920 Excessive and frequent menstruation with regular cycle: Secondary | ICD-10-CM | POA: Diagnosis not present

## 2017-03-29 ENCOUNTER — Ambulatory Visit (HOSPITAL_COMMUNITY)
Admission: RE | Admit: 2017-03-29 | Discharge: 2017-03-29 | Disposition: A | Discharge status: Home / Self Care | Payer: BLUE CROSS/BLUE SHIELD | Source: Ambulatory Visit | Attending: Obstetrics and Gynecology | Admitting: Obstetrics and Gynecology

## 2017-03-29 DIAGNOSIS — R9389 Abnormal findings on diagnostic imaging of other specified body structures: Secondary | ICD-10-CM | POA: Insufficient documentation

## 2017-03-29 DIAGNOSIS — Z3049 Encounter for surveillance of other contraceptives: Secondary | ICD-10-CM | POA: Diagnosis not present

## 2017-03-29 DIAGNOSIS — Z308 Encounter for other contraceptive management: Secondary | ICD-10-CM

## 2017-03-29 DIAGNOSIS — Z3040 Encounter for surveillance of contraceptives, unspecified: Secondary | ICD-10-CM | POA: Insufficient documentation

## 2017-03-29 DIAGNOSIS — Z9851 Tubal ligation status: Secondary | ICD-10-CM | POA: Diagnosis not present

## 2017-03-29 MED ORDER — IOPAMIDOL (ISOVUE-300) INJECTION 61%
30.0000 mL | Freq: Once | INTRAVENOUS | Status: AC | PRN
  Administered 2017-03-29: 40 mL

## 2017-04-02 DIAGNOSIS — N840 Polyp of corpus uteri: Secondary | ICD-10-CM | POA: Diagnosis not present

## 2017-04-02 DIAGNOSIS — N920 Excessive and frequent menstruation with regular cycle: Secondary | ICD-10-CM | POA: Diagnosis not present

## 2017-04-06 DIAGNOSIS — N920 Excessive and frequent menstruation with regular cycle: Secondary | ICD-10-CM | POA: Diagnosis not present

## 2017-05-03 DIAGNOSIS — D649 Anemia, unspecified: Secondary | ICD-10-CM | POA: Diagnosis not present

## 2017-05-18 DIAGNOSIS — Z113 Encounter for screening for infections with a predominantly sexual mode of transmission: Secondary | ICD-10-CM | POA: Diagnosis not present

## 2017-05-18 DIAGNOSIS — Z30430 Encounter for insertion of intrauterine contraceptive device: Secondary | ICD-10-CM | POA: Diagnosis not present

## 2017-06-17 DIAGNOSIS — Z30431 Encounter for routine checking of intrauterine contraceptive device: Secondary | ICD-10-CM | POA: Diagnosis not present

## 2020-04-02 ENCOUNTER — Other Ambulatory Visit: Payer: Self-pay

## 2020-04-02 DIAGNOSIS — Z20822 Contact with and (suspected) exposure to covid-19: Secondary | ICD-10-CM

## 2020-04-03 LAB — NOVEL CORONAVIRUS, NAA: SARS-CoV-2, NAA: NOT DETECTED

## 2020-04-03 LAB — SARS-COV-2, NAA 2 DAY TAT
